# Patient Record
Sex: Female | Born: 1985 | Race: White | Hispanic: Yes | Marital: Single | Smoking: Never smoker
Health system: Southern US, Community
[De-identification: ages and names within clinical notes are randomized; demographics above are authoritative.]

## PROBLEM LIST (undated history)

## (undated) DIAGNOSIS — R87619 Unspecified abnormal cytological findings in specimens from cervix uteri: Secondary | ICD-10-CM

---
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
## 2013-07-30 DIAGNOSIS — R87619 Unspecified abnormal cytological findings in specimens from cervix uteri: Secondary | ICD-10-CM

## 2013-07-30 HISTORY — DX: Unspecified abnormal cytological findings in specimens from cervix uteri: R87.619

## 2017-04-03 ENCOUNTER — Other Ambulatory Visit: Payer: Self-pay | Admitting: Nurse Practitioner

## 2017-04-03 DIAGNOSIS — Z3482 Encounter for supervision of other normal pregnancy, second trimester: Secondary | ICD-10-CM

## 2017-04-10 ENCOUNTER — Ambulatory Visit
Admission: RE | Admit: 2017-04-10 | Discharge: 2017-04-10 | Disposition: A | Discharge status: Home / Self Care | Payer: Medicaid Other | Source: Ambulatory Visit | Attending: Nurse Practitioner | Admitting: Nurse Practitioner

## 2017-04-10 DIAGNOSIS — O321XX0 Maternal care for breech presentation, not applicable or unspecified: Secondary | ICD-10-CM | POA: Insufficient documentation

## 2017-04-10 DIAGNOSIS — Z3689 Encounter for other specified antenatal screening: Secondary | ICD-10-CM | POA: Diagnosis present

## 2017-04-10 DIAGNOSIS — O3412 Maternal care for benign tumor of corpus uteri, second trimester: Secondary | ICD-10-CM | POA: Diagnosis not present

## 2017-04-10 DIAGNOSIS — Z3A15 15 weeks gestation of pregnancy: Secondary | ICD-10-CM | POA: Diagnosis not present

## 2017-04-10 DIAGNOSIS — Z3482 Encounter for supervision of other normal pregnancy, second trimester: Secondary | ICD-10-CM

## 2017-04-18 ENCOUNTER — Other Ambulatory Visit (HOSPITAL_COMMUNITY): Payer: Self-pay | Admitting: Family Medicine

## 2017-04-18 DIAGNOSIS — Z3689 Encounter for other specified antenatal screening: Secondary | ICD-10-CM

## 2017-05-16 ENCOUNTER — Ambulatory Visit
Admission: RE | Admit: 2017-05-16 | Discharge: 2017-05-16 | Disposition: A | Discharge status: Home / Self Care | Payer: Medicaid Other | Source: Ambulatory Visit | Attending: Obstetrics and Gynecology | Admitting: Obstetrics and Gynecology

## 2017-05-16 ENCOUNTER — Encounter: Payer: Self-pay | Admitting: *Deleted

## 2017-05-16 DIAGNOSIS — O2632 Retained intrauterine contraceptive device in pregnancy, second trimester: Secondary | ICD-10-CM | POA: Diagnosis not present

## 2017-05-16 DIAGNOSIS — Z3689 Encounter for other specified antenatal screening: Secondary | ICD-10-CM | POA: Insufficient documentation

## 2017-05-16 DIAGNOSIS — Z3A19 19 weeks gestation of pregnancy: Secondary | ICD-10-CM | POA: Diagnosis not present

## 2017-07-15 LAB — HM HIV SCREENING LAB: HM HIV Screening: NEGATIVE

## 2017-12-27 ENCOUNTER — Encounter (HOSPITAL_COMMUNITY): Payer: Self-pay

## 2018-10-23 IMAGING — US US OB COMP +14 WK
1 series · 13 of 28 positions shown · non-contrast
Comparison: none

CLINICAL DATA: Unknown dates, supervision of normal pregnancy in
the second trimester.

EXAM:
OBSTETRICAL ULTRASOUND >14 WKS

[Series 1: us ob comp +14 wk · 39 acquisitions, 13 frames shown]
[im 2/39]
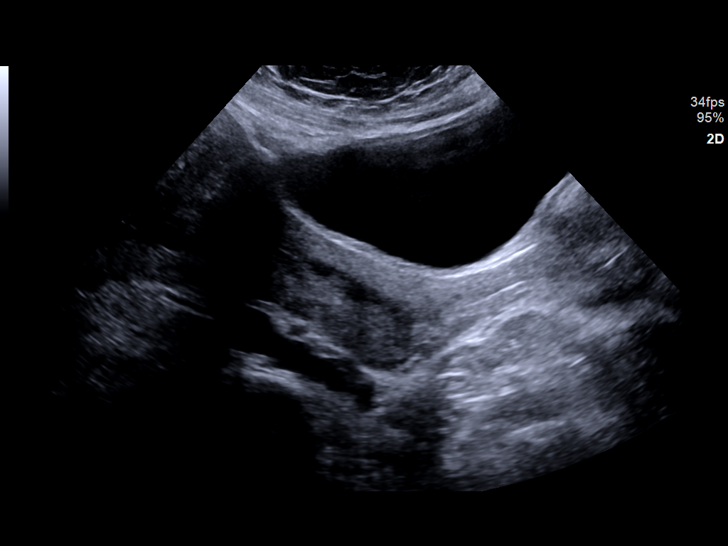
[im 5/39]
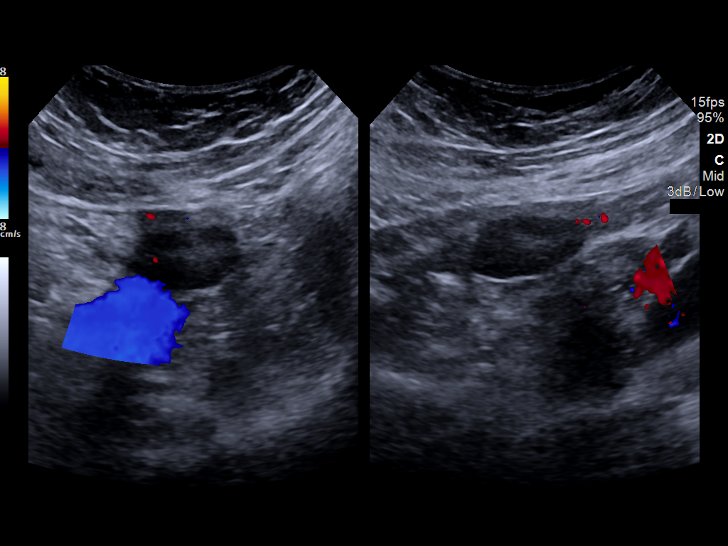
[im 8/39]
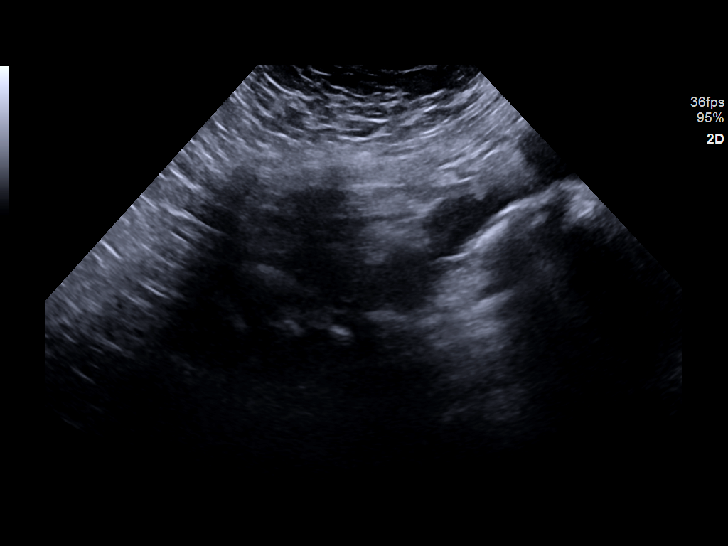
[im 10/39]
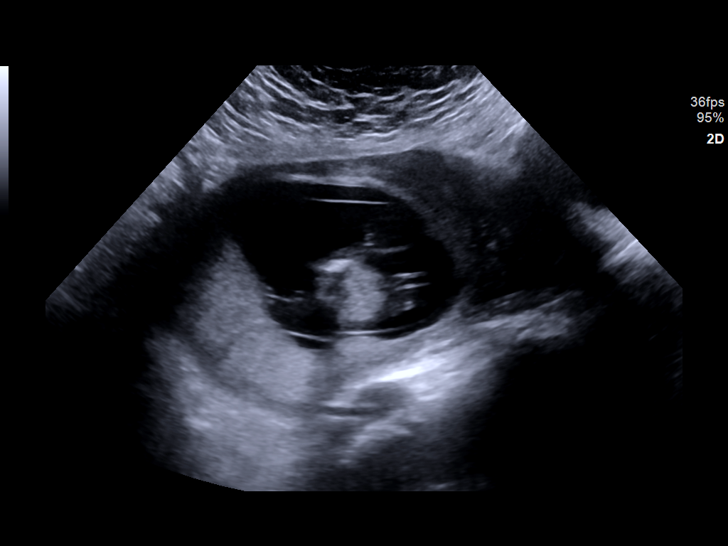
[im 13/39]
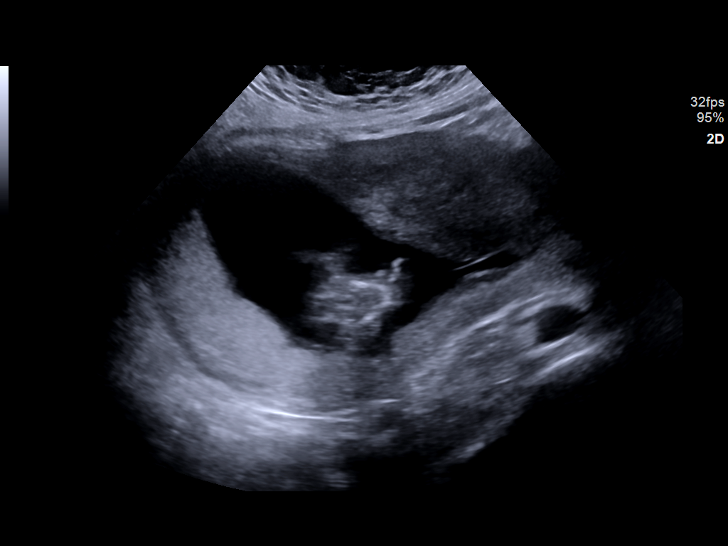
[im 16/39]
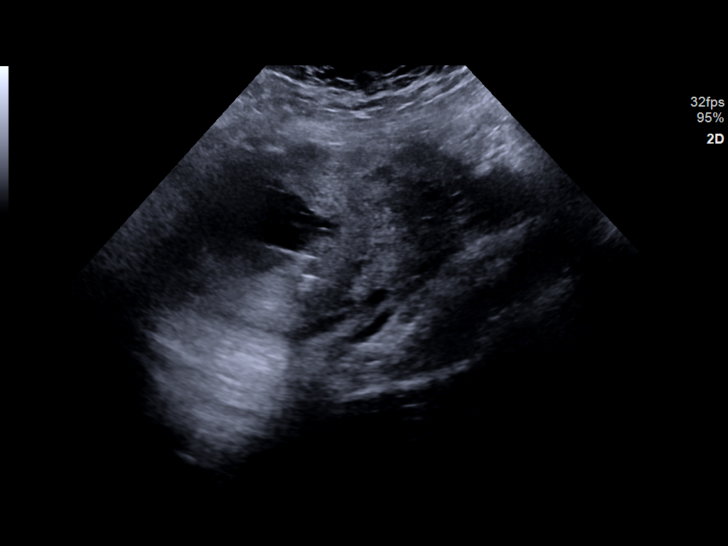
[im 20/39]
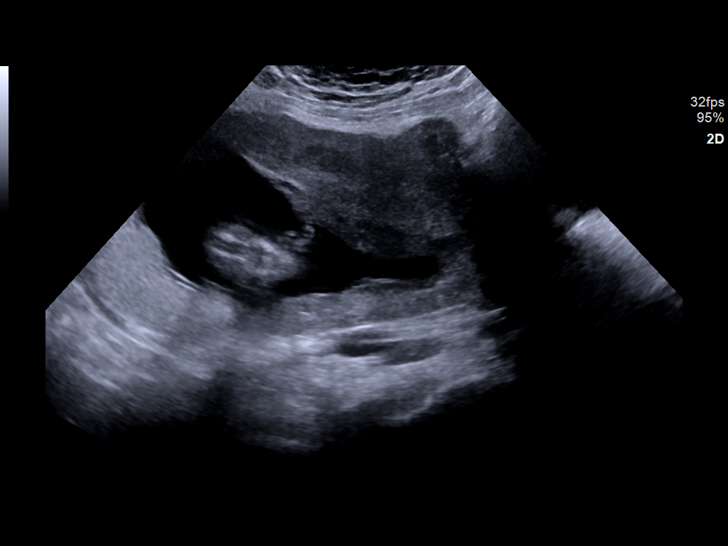
[im 23/39]
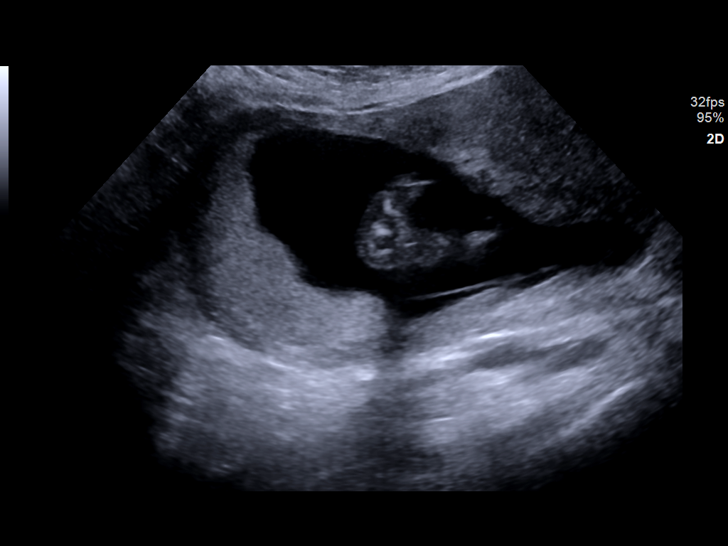
[im 26/39]
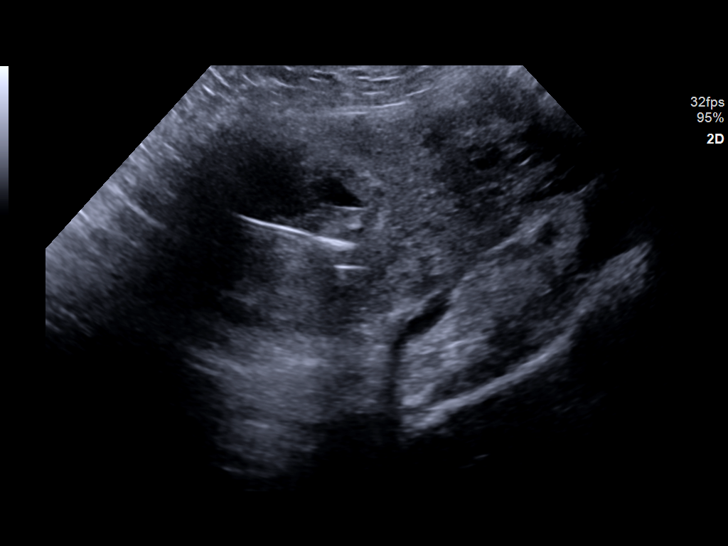
[im 29/39]
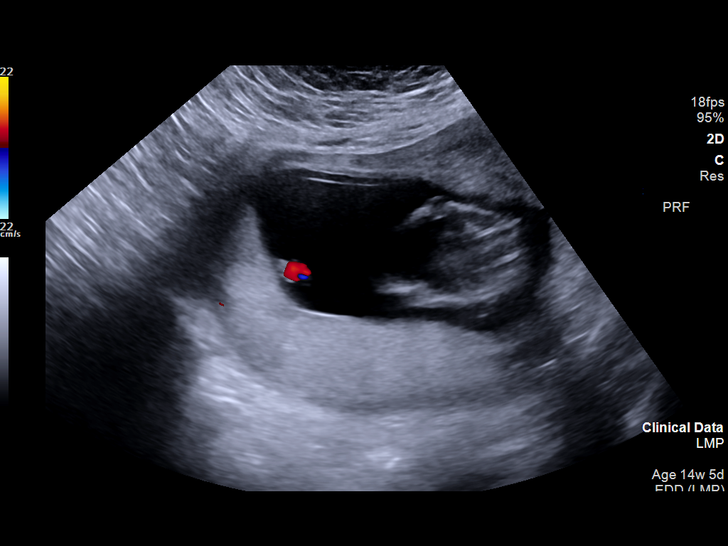
[im 31/39]
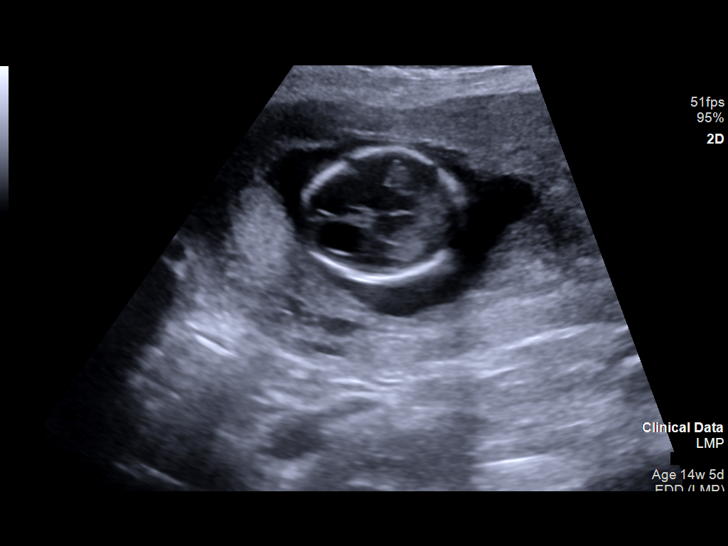
[im 34/39]
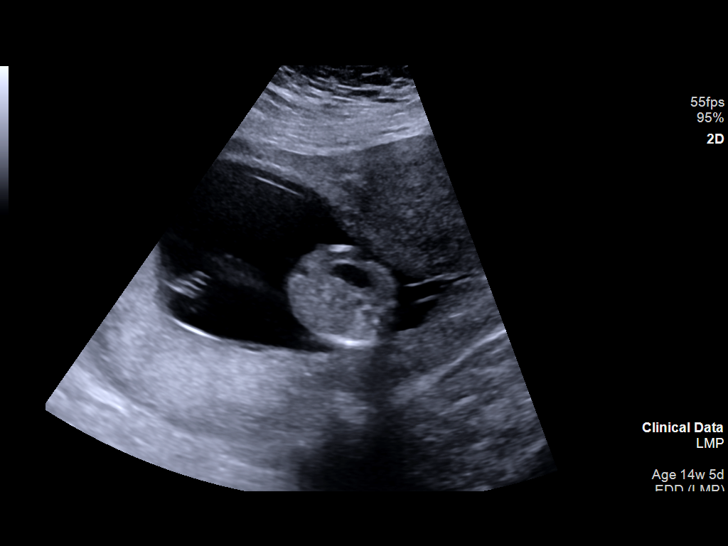
[im 37/39]
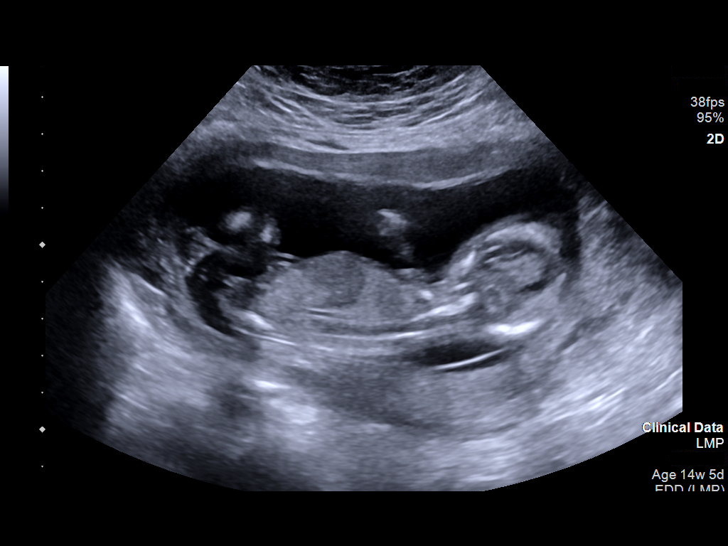

[13 of 28 positions shown; findings below may reference images not displayed]

FINDINGS: Number of Fetuses: 1

Heart Rate:  143 bpm

Movement: Present

Presentation: Breech

Previa: None. The distance from the lower placental segment to the
internal cervical os is 6.7 cm

Placental Location: Posterior partially fundal and partially
right-sided.

Amniotic Fluid (Subjective): Normal

Vertical pocket 3.5cm

FETAL BIOMETRY

BPD:  2.88cm 15w 1d

HC:    10.69cm  15w   1d

AC:   9.08cm  15w   2d

FL:   1.69cm  15w   0d

Current Mean GA: 15w 1d              US EDC: October 01, 2017

FETAL ANATOMY

Lateral Ventricles: Not visualized

Thalami/CSP: Not visualized

Posterior Fossa:  Not visualize

Nuchal Region: Not visualized

Upper Lip: Not visualize

Spine: Not visualized

4 Chamber Heart on Left: Visualized

LVOT: Not visualize

RVOT: Not visualize

Stomach on Left: Visualized

3 Vessel Cord: Visualized

Cord Insertion site: Visualized

Kidneys: Not visualized

Bladder: Visualized

Extremities: Not visualized

Sex: Not visualized

This study is quite limited due to the early gestational age.

Maternal Findings: There is an anterior fundal fibroid that appears
pedunculated measuring 2.1 x 1.1 x 1.8 cm. Normal appearing ovaries

Cervix:  5.2 cm, closed.
IMPRESSION: Single viable IUP with estimated gestational age of 15 weeks 1 day
with estimated date of confinement October 01, 2017. The anatomic
survey was quite limited due to the early age. Follow-up anatomic
scan at 20-22 weeks is recommended.

The amniotic fluid volume is estimated to be normal. The placenta is
partially fundal, or posterior, and right sided.

Pedunculated anterior fundal fibroid measuring up to 2.1 cm in
diameter.

## 2018-11-20 LAB — HM PAP SMEAR: HM Pap smear: NEGATIVE

## 2019-03-02 ENCOUNTER — Encounter: Payer: Self-pay | Admitting: Physician Assistant

## 2019-03-02 ENCOUNTER — Ambulatory Visit: Payer: Medicaid Other | Admitting: Physician Assistant

## 2019-03-02 ENCOUNTER — Ambulatory Visit: Payer: Medicaid Other

## 2019-03-02 ENCOUNTER — Other Ambulatory Visit: Payer: Self-pay

## 2019-03-02 DIAGNOSIS — B3731 Acute candidiasis of vulva and vagina: Secondary | ICD-10-CM

## 2019-03-02 DIAGNOSIS — Z113 Encounter for screening for infections with a predominantly sexual mode of transmission: Secondary | ICD-10-CM

## 2019-03-02 DIAGNOSIS — B373 Candidiasis of vulva and vagina: Secondary | ICD-10-CM

## 2019-03-02 LAB — WET PREP FOR TRICH, YEAST, CLUE
Trichomonas Exam: NEGATIVE
Yeast Exam: NEGATIVE

## 2019-03-02 MED ORDER — FLUCONAZOLE 150 MG PO TABS: 150.0000 mg | ORAL_TABLET | Freq: Once | ORAL | 0 refills | Status: AC

## 2019-03-02 NOTE — Progress Notes (Signed)
STI clinic/screening visit  Subjective:  Katie Cruz is a 33 y.o. female being seen today for an STI screening visit. The patient reports they do have symptoms.  Patient has the following medical conditions:  There are no active problems to display for this patient.    Chief Complaint  Patient presents with  . SEXUALLY TRANSMITTED DISEASE    HPI  Patient reports that she has had vaginal itching for about 1 week.  Used OTC yeast treatment with only slight relief.  Last use of OTC cream was last pm externally with Vagisil.  States that she had this problem previously, about 3-4 years ago and we gave her a pill that cleared things up for her.    See flowsheet for further details and programmatic requirements.    The following portions of the patient's history were reviewed and updated as appropriate: allergies, current medications, past medical history, past social history, past surgical history and problem list.  Objective:  There were no vitals filed for this visit.  Physical Exam Constitutional:      General: She is not in acute distress.    Appearance: Normal appearance.  HENT:     Head: Normocephalic and atraumatic.     Mouth/Throat:     Mouth: Mucous membranes are moist.     Pharynx: Oropharynx is clear. No oropharyngeal exudate or posterior oropharyngeal erythema.  Neck:     Musculoskeletal: Neck supple.  Pulmonary:     Effort: Pulmonary effort is normal.  Abdominal:     Palpations: Abdomen is soft. There is no mass.     Tenderness: There is no abdominal tenderness. There is no guarding or rebound.  Genitourinary:    Rectum: Normal.     Comments: External genitalia/pubic area without nits, lice, edema, lesions and  Inguinal adenopathy.  Bilateral labia majora with mild erythema, slight scaling. Vagina with normal mucosa, small amount of white, thick, slightly clumpy discharge.  Cervix without visible lesions. Uterus normal size, firm, mobile, nt, no  masses, no CMT, no adnexal tenderness or fullness.  Lymphadenopathy:     Cervical: No cervical adenopathy.  Skin:    General: Skin is warm and dry.     Findings: No bruising, erythema, lesion or rash.  Neurological:     Mental Status: She is alert and oriented to person, place, and time.  Psychiatric:        Mood and Affect: Mood normal.        Behavior: Behavior normal.        Thought Content: Thought content normal.        Judgment: Judgment normal.       Assessment and Plan:  Katie Cruz is a 33 y.o. female presenting to the [facility] for STI screening  1. Screening for STD (sexually transmitted disease) Patient with vaginal itching not relieved with OTC products. Rec condoms with all sex Await test results.  Counseled that RN will call if needs to RTC for further treatment once results are back - WET PREP FOR TRICH, YEAST, CLUE - Chlamydia/Gonorrhea [place] [facility] - HIV [place] [facility] - Syphilis Serology, [place] [facility]  2. Candidiasis of vulva and vagina Will treat with Fluconazole 150 mg #1 take po one time Rec continue with OTC cream externally for symptom control for 24 hrs. RTC if needed if symptoms do not resolve. - fluconazole (DIFLUCAN) 150 MG tablet; Take 1 tablet (150 mg total) by mouth once for 1 dose.  Dispense: 1 tablet; Refill: 0     No follow-ups on file.  No future appointments.  Jerene Dilling, PA

## 2019-05-08 IMAGING — US US MFM OB DETAIL+14 WK
1 series · 12 of 28 positions shown · non-contrast
Comparison: none

PATIENT INFO:

PERFORMED BY:
NOOSHIN
SERVICE(S) PROVIDED:
INDICATIONS:
19 weeks gestation of pregnancy
IUD in pregnancy
FETAL EVALUATION:
Num Of Fetuses:     1
Fetal Heart         139
Rate(bpm):
Fetal Lie:          Maternal Left
Presentation:       Cephalic
Placenta:           Posterior Grade 0, No previa
P. Cord Insertion:  Normal
BIOMETRY:
BPD:      46.8  mm     G. Age:  20w 1d         63  %    CI:        73.31   %    70 - 86
FL/HC:      18.5   %    16.8 -
HC:      173.7  mm     G. Age:  19w 6d         45  %    HC/AC:      1.10        1.09 -
AC:      157.3  mm     G. Age:  20w 6d         77  %    FL/BPD:     68.6   %
FL:       32.1  mm     G. Age:  20w 0d         48  %    FL/AC:      20.4   %    20 - 24
HUM:      31.6  mm     G. Age:  20w 4d         71  %
CER:      19.2  mm     G. Age:  18w 4d         15  %
NFT:       3.9  mm
CM:        2.9  mm
Est. FW:     352  gm    0 lb 12 oz      71  %
GESTATIONAL AGE:
LMP:           19w 6d        Date:  12/28/16                 EDD:   10/04/17
U/S Today:     20w 2d                                        EDD:   10/01/17
Best:          19w 6d     Det. By:  LMP  (12/28/16)          EDD:   10/04/17
ANATOMY:
Cranium:               Within Normal Limits   Aortic Arch:            Normal appearance
Cavum:                 CSP visualized         Ductal Arch:            Normal appearance
Ventricles:            Normal appearance      Diaphragm:              Within Normal Limits
Choroid Plexus:        Within Normal Limits   Stomach:                Seen
Cerebellum:            Within Normal Limits   Abdomen:                Within Normal
Limits
Posterior Fossa:       Within Normal Limits   Abdominal Wall:         Normal appearance
Nuchal Fold:           Within Normal Limits   Cord Vessels:           3 vessels
Face:                  Orbits visualized      Kidneys:                Normal appearance
Lips:                  Normal appearance      Bladder:                Seen
Thoracic:              Within Normal Limits   Spine:                  Normal appearance
Heart:                 4-Chamber view         Upper Extremities:      Visualized
appears normal
RVOT:                  Normal appearance      Lower Extremities:      Visualized
LVOT:                  Normal appearance
CERVIX UTERUS ADNEXA:
Cervix
Length:           3.78  cm.

[Series 1: us mfm ob detail+14 wk · 0.23mm/px · 12 of 120 slices shown]
[im 5/120]
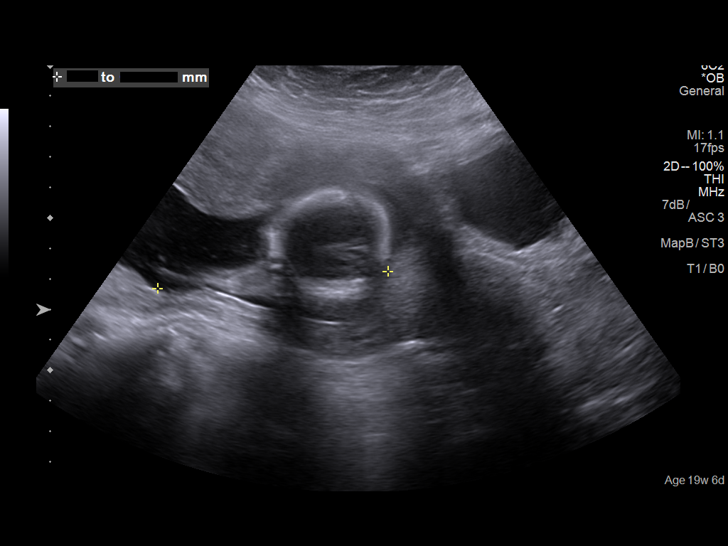
[im 14/120]
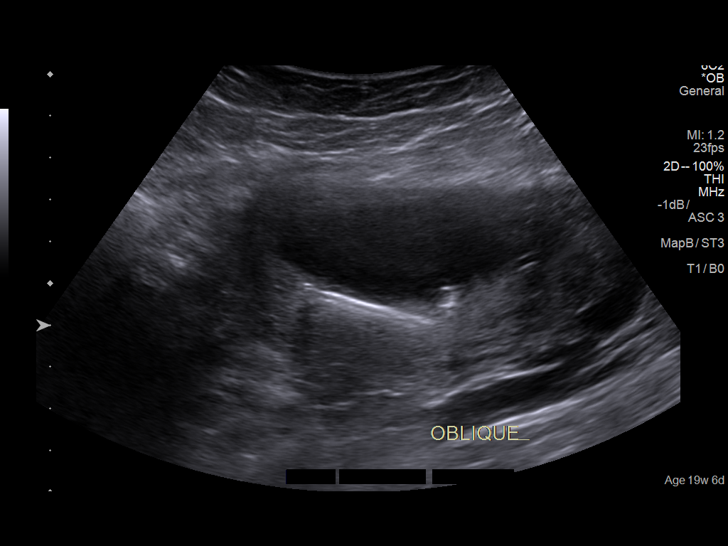
[im 23/120]
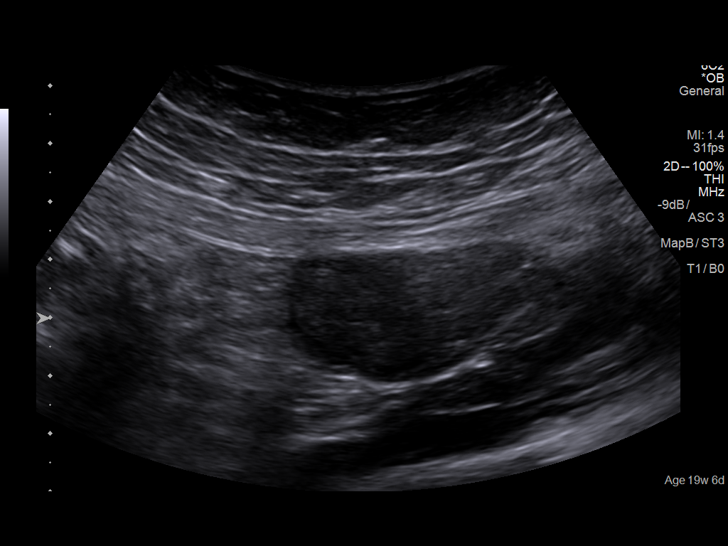
[im 36/120]
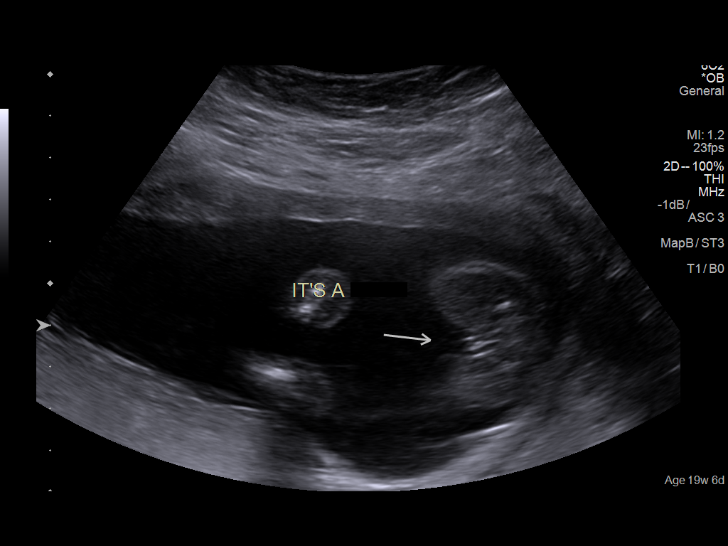
[im 45/120]
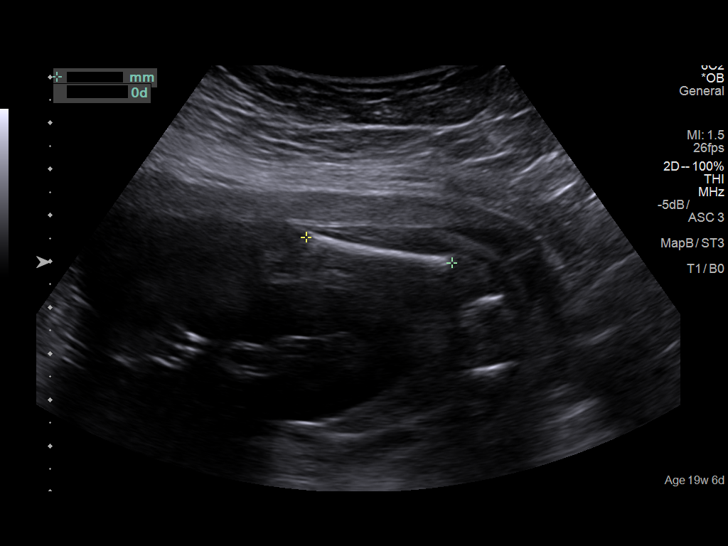
[im 53/120]
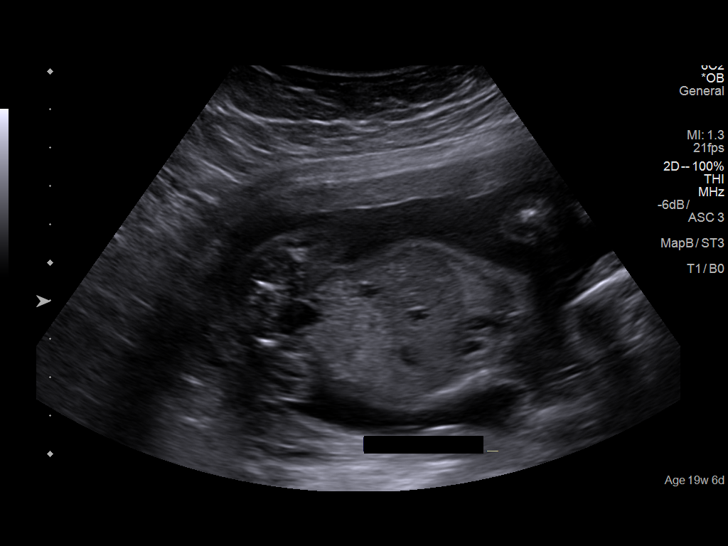
[im 67/120]
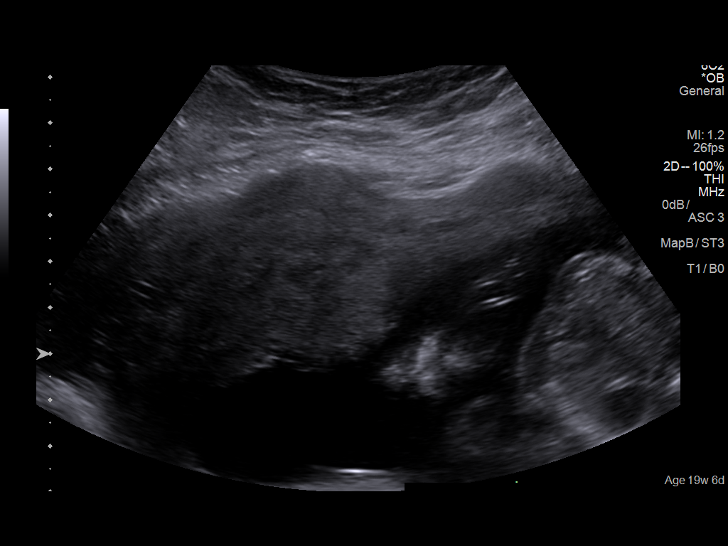
[im 75/120]
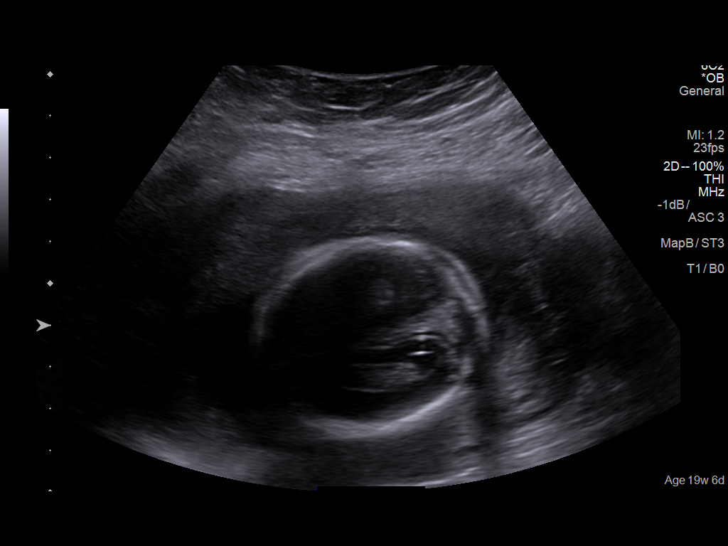
[im 84/120]
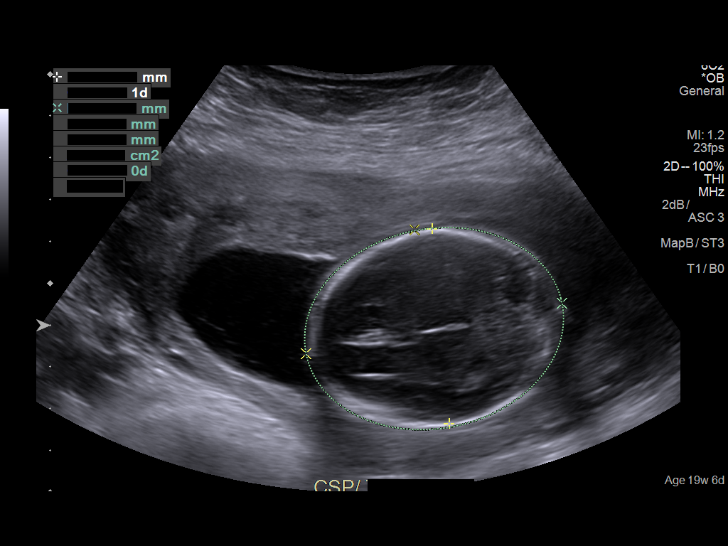
[im 97/120]
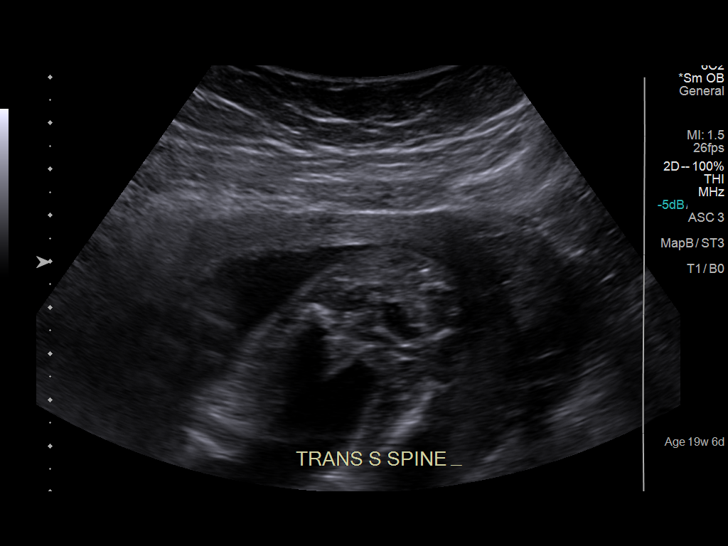
[im 106/120]
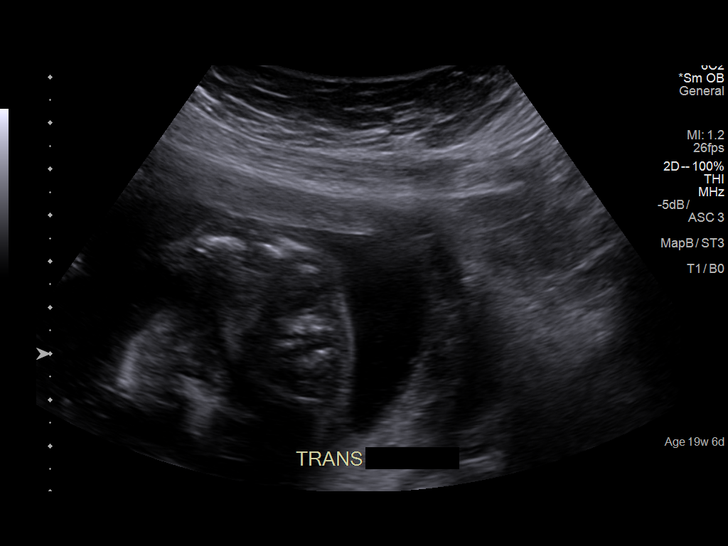
[im 115/120]
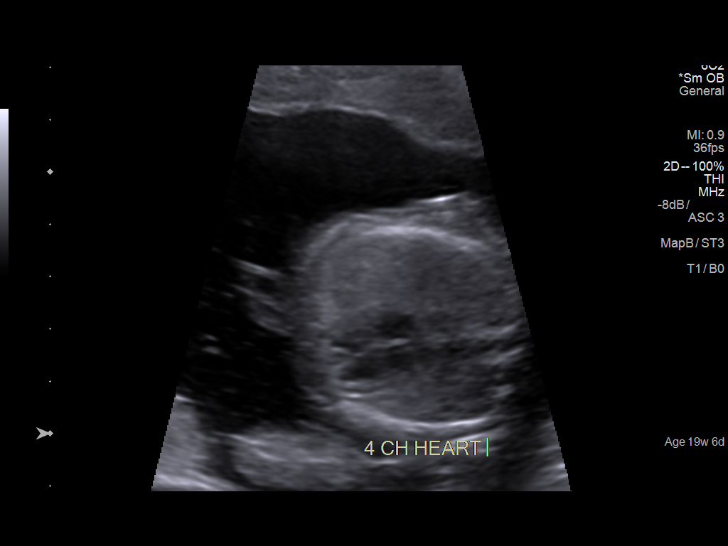

[12 of 28 positions shown; findings below may reference images not displayed]

IMPRESSION: Dear   Dr  NOOSHIN,

Thank you for referring your patient  for a fetal anatomical
survey.

There is a singleton gestation with subjectively normal
amniotic fluid volume.

The fetal biometry correlates with established dating. Pt had
a LMP of 12/28/2016 with Zakria Jamil IUD in place placing her at
19w 6d  with an EDC of 10/04/2017.

Detailed evaluation of the fetal anatomy was performed.  The
fetal anatomical survey appears within normal limits within
the resolution of ultrasound as described above.  It must be
noted that a normal ultrasound cannot rule out aneuploidy.

The Paragard IUD is identified in the upper left fundus.

The presence of an IUD in pregnancy may increase the risk
of a preterm delivery/low birthweight infant .

Thank you for allowing us to participate in your patient's care.

assistance

## 2019-07-06 ENCOUNTER — Ambulatory Visit: Payer: Self-pay | Admitting: Physician Assistant

## 2019-07-06 ENCOUNTER — Other Ambulatory Visit: Payer: Self-pay

## 2019-07-06 DIAGNOSIS — Z299 Encounter for prophylactic measures, unspecified: Secondary | ICD-10-CM

## 2019-07-06 DIAGNOSIS — Z113 Encounter for screening for infections with a predominantly sexual mode of transmission: Secondary | ICD-10-CM

## 2019-07-06 LAB — WET PREP FOR TRICH, YEAST, CLUE
Trichomonas Exam: NEGATIVE
Yeast Exam: NEGATIVE

## 2019-07-06 MED ORDER — FLUCONAZOLE 150 MG PO TABS: 150.0000 mg | ORAL_TABLET | Freq: Once | ORAL | 0 refills | Status: AC

## 2019-07-07 ENCOUNTER — Encounter: Payer: Self-pay | Admitting: Physician Assistant

## 2019-07-07 NOTE — Progress Notes (Signed)
STI clinic/screening visit  Subjective:  Katie Cruz is a 33 y.o. female being seen today for an STI screening visit. The patient reports they do have symptoms.   Patient has the following medical conditions:  There are no active problems to display for this patient.    Chief Complaint  Patient presents with  . SEXUALLY TRANSMITTED DISEASE    HPI  Patient reports that she has had itching in her vaginal area for about 1 month and has tried OTC creams which relieve symptoms for a day or two and then the itching returns.  States that this happened before and she got a pill that resolved the itching for a long time.  LMP 06/19/2019 and normal.  Using IUD as BCM.  See flowsheet for further details and programmatic requirements.    The following portions of the patient's history were reviewed and updated as appropriate: allergies, current medications, past medical history, past social history, past surgical history and problem list.  Objective:  There were no vitals filed for this visit.  Physical Exam Constitutional:      General: She is not in acute distress.    Appearance: Normal appearance. She is normal weight.  HENT:     Head: Normocephalic and atraumatic.     Comments: No nits, lice, or hair loss. No cervical, supraclavicular or axillary adenopathy.    Mouth/Throat:     Mouth: Mucous membranes are moist.     Pharynx: Oropharynx is clear. No oropharyngeal exudate or posterior oropharyngeal erythema.  Eyes:     Conjunctiva/sclera: Conjunctivae normal.  Neck:     Musculoskeletal: Neck supple. No muscular tenderness.  Pulmonary:     Effort: Pulmonary effort is normal.  Abdominal:     Palpations: Abdomen is soft. There is no mass.     Tenderness: There is no abdominal tenderness. There is no guarding or rebound.  Genitourinary:    General: Normal vulva.     Rectum: Normal.     Comments: External genitalia/pubic area without nits, lice, edema, erythema,  lesions, and inguinal adenopathy. Vagina with normal mucosa and a small amount of clumpy, white discharge, pH=4.5. Cervix without visible lesions. Uterus firm, mobile, nt, no masses, no CMT, no adnexal tenderness or fullness. Skin:    General: Skin is warm and dry.     Findings: No bruising, erythema, lesion or rash.  Neurological:     Mental Status: She is alert and oriented to person, place, and time.  Psychiatric:        Mood and Affect: Mood normal.        Behavior: Behavior normal.        Thought Content: Thought content normal.        Judgment: Judgment normal.       Assessment and Plan:  Katie Cruz is a 33 y.o. female presenting to the [facility] Department for STI screening  1. Screening for STD (sexually transmitted disease) Patient into clinic with symptoms. Rec condoms with all sex. Await test results.  Counseled that RN will call if needs to RTC for treatment once results are back.  - WET PREP FOR [place], YEAST, CLUE - Chlamydia/Gonorrhea [place] [facility] - HIV [place] [facility] - Syphilis Serology, [place] [facility]  2. Prophylactic measure Will treat with Fluconazole 150mg  #1 po one time due to symptoms and exam findings. No sex for 7 days. Reviewed steps to prevent yeast infection with patient. - fluconazole (DIFLUCAN) 150 MG tablet; Take  1 tablet (150 mg total) by mouth once for 1 dose.  Dispense: 1 tablet; Refill: 0     No follow-ups on file.  No future appointments.  Matt Holmes, PA

## 2019-10-04 NOTE — Progress Notes (Signed)
Chart reviewed by Pharmacist  Suzanne Walker PharmD, Contract Pharmacist at  [facility]  

## 2020-01-20 ENCOUNTER — Encounter: Payer: Self-pay | Admitting: Advanced Practice Midwife

## 2020-01-20 ENCOUNTER — Ambulatory Visit (LOCAL_COMMUNITY_HEALTH_CENTER): Payer: Self-pay | Admitting: Advanced Practice Midwife

## 2020-01-20 ENCOUNTER — Other Ambulatory Visit: Payer: Self-pay

## 2020-01-20 VITALS — BP 99/61 | Ht 62.0 in | Wt 168.0 lb

## 2020-01-20 DIAGNOSIS — E669 Obesity, unspecified: Secondary | ICD-10-CM | POA: Insufficient documentation

## 2020-01-20 DIAGNOSIS — Z3009 Encounter for other general counseling and advice on contraception: Secondary | ICD-10-CM

## 2020-01-20 DIAGNOSIS — Z30431 Encounter for routine checking of intrauterine contraceptive device: Secondary | ICD-10-CM

## 2020-01-20 LAB — WET PREP FOR TRICH, YEAST, CLUE
Trichomonas Exam: NEGATIVE
Yeast Exam: NEGATIVE

## 2020-01-20 MED ORDER — THERA VITAL M PO TABS: 1.0000 | ORAL_TABLET | Freq: Every day | ORAL | 0 refills | Status: AC

## 2020-01-20 NOTE — Progress Notes (Signed)
Family Planning Visit- Initial Visit  Subjective:  Katie Cruz is a 34 y.o. MHF nonsmoker G5P5   being seen today for an initial well woman visit and c/o spotting/bleeding since LMP 01/07/20.  She is currently using paraguard for pregnancy prevention. Patient reports she does not want a pregnancy in the next year.  Patient has the following medical conditions has Obesity BMI=30.7 on their problem list.  Chief Complaint  Patient presents with  . Annual Exam    Patient reports bleeding since LMP 01/07/20. Paraguard inserted 11/19/17.  Last pap 11/20/18 neg HPV neg.  Last PE 10/2017.  CBE due 2023.  Last sex 01/02/20 without condom; with same partner x 5 years. Patient denies cigs, MJ, ETOH.  Living with husband and 4 children.  Working [employer]   Body mass index is 30.73 kg/m. - Patient is eligible for diabetes screening based on BMI and age >62?  not applicable IR4W ordered? not applicable  Patient reports 1 of partners in last year. Desires STI screening?  Yes  Has patient been screened once for HCV in the past?  No  No results found for: HCVAB  Does the patient have current of drug use, have a partner with drug use, and/or has been incarcerated since last result? No  If yes-- Screen for HCV through [facility]   Does the patient meet criteria for HBV testing? No  Criteria:  -Household, sexual or needle sharing contact with HBV -History of drug use -HIV positive -Those with known Hep C   Health Maintenance Due  Topic Date Due  . Hepatitis C Screening  Never done  . COVID-19 Vaccine (1) Never done  . TETANUS/TDAP  Never done    Review of Systems  All other systems reviewed and are negative.   The following portions of the patient's history were reviewed and updated as appropriate: allergies, current medications, past family history, past medical history, past social history, past surgical history and problem list. Problem list updated.   See flowsheet for other program  required questions.  Objective:   Vitals:   01/20/20 0938  BP: 99/61  Weight: 168 lb (76.2 kg)  Height: 5\' 2"  (1.575 m)    Physical Exam Constitutional:      Appearance: Normal appearance. She is obese.  HENT:     Head: Normocephalic and atraumatic.     Mouth/Throat:     Mouth: Mucous membranes are moist.  Eyes:     Conjunctiva/sclera: Conjunctivae normal.  Cardiovascular:     Rate and Rhythm: Normal rate and regular rhythm.  Pulmonary:     Effort: Pulmonary effort is normal.     Breath sounds: Normal breath sounds.  Abdominal:     Palpations: Abdomen is soft.     Comments: Soft without masses or tenderness, poor tone, increased adipose  Genitourinary:    General: Normal vulva.     Exam position: Lithotomy position.     Vagina: Bleeding (red menses bleeding,ph>4.5) present.     Cervix: Normal.     Uterus: Normal.      Adnexa: Right adnexa normal and left adnexa normal.     Rectum: Normal.     Comments: Paraguard strings visible and palpated Musculoskeletal:        General: Normal range of motion.     Cervical back: Normal range of motion and neck supple.  Skin:    General: Skin is warm and dry.  Neurological:     Mental Status: She is alert.  Psychiatric:        Mood and Affect: Mood normal.       Assessment and Plan:  Katie Cruz is a 34 y.o. MHF G5P5 nonsmoker female presenting to the [facility] Department for an initial well woman exam c/o spotting/bleeding since LMP 01/07/20  Contraception counseling: Reviewed all forms of birth control options in the tiered based approach. available including abstinence; over the counter/barrier methods; hormonal contraceptive medication including pill, patch, ring, injection,contraceptive implant, ECP; hormonal and nonhormonal IUDs; permanent sterilization options including vasectomy and the various tubal sterilization modalities. Risks, benefits, and typical effectiveness rates were reviewed.  Questions  were answered.  Written information was also given to the patient to review.  Patient desires nothing, happy with Paraguard, this was prescribed for patient. She will follow up in prn for surveillance.  She was told to call with any further questions, or with any concerns about this method of contraception.  Emphasized use of condoms 100% of the time for STI prevention.  Patient was offered ECP. ECP was not accepted by the patient. ECP counseling was not given - see RN documentation  1. Obesity, unspecified classification, unspecified obesity type, unspecified whether serious comorbidity present   2. Family planning Treat wet mount per standing orders Immunization nurse consult Pt to monitor bleeding and mark on calendar.  RTC if continues with next menses - WET PREP FOR TRICH, YEAST, CLUE - Chlamydia/Gonorrhea [place] [facility]  3. Encounter for routine checking of intrauterine contraceptive device (IUD)      No follow-ups on file.  No future appointments.  Alberteen Spindle, CNM

## 2020-01-20 NOTE — Progress Notes (Signed)
Wet mount reviewed, no treatment indicated. MVI given and patient counseled, per provider, to keep track of her spotting and see what happens with menses next month, and to call if she has further questions. Patient states understanding.Burt Knack, RN

## 2020-01-20 NOTE — Progress Notes (Signed)
Patient here for yearly PE and c/o past 2 weeks spotting. Last PE 10/2017 and has Paraguard since 10/2017. Last Pap 11/20/2018, negative and HPV negative.Burt Knack, RN

## 2022-07-20 ENCOUNTER — Ambulatory Visit: Payer: Self-pay | Admitting: Nurse Practitioner

## 2022-07-20 DIAGNOSIS — Z113 Encounter for screening for infections with a predominantly sexual mode of transmission: Secondary | ICD-10-CM

## 2022-07-20 DIAGNOSIS — N898 Other specified noninflammatory disorders of vagina: Secondary | ICD-10-CM

## 2022-07-20 LAB — WET PREP FOR TRICH, YEAST, CLUE
Trichomonas Exam: NEGATIVE
Yeast Exam: NEGATIVE

## 2022-07-20 MED ORDER — CLOTRIMAZOLE 3 2 % VA CREA: 1.0000 | TOPICAL_CREAM | Freq: Every day | VAGINAL | 0 refills | Status: AC

## 2022-07-20 NOTE — Progress Notes (Signed)
Patient treated per provider orders. Clotrimazole given.Burt Knack, RN

## 2022-07-20 NOTE — Progress Notes (Signed)
Patient here for STD testing.Livingston Denner Brewer-Jensen, RN 

## 2022-07-21 ENCOUNTER — Encounter: Payer: Self-pay | Admitting: Nurse Practitioner

## 2022-07-21 NOTE — Progress Notes (Signed)
[facility] Department  STI clinic/screening visit [address] [place] [address] [phone]  Subjective:  Katie Cruz is a 36 y.o. female being seen today for an STI screening visit. The patient reports they do have symptoms.  Patient reports that they do not desire a pregnancy in the next year.   They reported they are not interested in discussing contraception today.   Patient reports having an IUD.    Patient's last menstrual period was 07/03/2022 (approximate).   Patient has the following medical conditions:   Patient Active Problem List   Diagnosis Date Noted   Obesity BMI=30.7 01/20/2020    Chief Complaint  Patient presents with   SEXUALLY TRANSMITTED DISEASE    HPI  Patient reports to clinic for an STD screening.  Patient reports some genital itching that has been present for one month and pretty consistent surrounding her last menstrual period.    Does the patient using douching products? No  Last HIV test per patient/review of record was: Lab Results  Component Value Date   HMHIVSCREEN Negative - Validated 07/15/2017   Patient reports last pap was: 11/20/2018  Screening for MPX risk: Does the patient have an unexplained rash? No Is the patient MSM? No Does the patient endorse multiple sex partners or anonymous sex partners? No Did the patient have close or sexual contact with a person diagnosed with MPX? No Has the patient traveled outside the [place] where MPX is endemic? No Is there a high clinical suspicion for MPX-- evidenced by one of the following No  -Unlikely to be chickenpox  -Lymphadenopathy  -Rash that present in same phase of evolution on any given body part See flowsheet for further details and programmatic requirements.   Immunization history:   There is no immunization history on file for this patient.   The following portions of the patient's history were reviewed and updated as appropriate: allergies,  current medications, past medical history, past social history, past surgical history and problem list.  Objective:  There were no vitals filed for this visit.  Physical Exam Constitutional:      Appearance: Normal appearance.  HENT:     Head: Normocephalic. No abrasion, masses or laceration. Hair is normal.     Right Ear: External ear normal.     Left Ear: External ear normal.     Nose: Nose normal.     Mouth/Throat:     Mouth: No oral lesions.     Pharynx: No oropharyngeal exudate or posterior oropharyngeal erythema.     Tonsils: No tonsillar exudate or tonsillar abscesses.  Eyes:     General: Lids are normal.        Right eye: No discharge.        Left eye: No discharge.     Conjunctiva/sclera: Conjunctivae normal.     Right eye: No exudate.    Left eye: No exudate. Abdominal:     General: Abdomen is flat.     Palpations: Abdomen is soft.     Tenderness: There is no abdominal tenderness. There is no rebound.  Genitourinary:    Pubic Area: No rash or pubic lice.      Labia:        Right: No rash, tenderness, lesion or injury.        Left: No rash, tenderness, lesion or injury.      Vagina: Normal. No vaginal discharge, erythema or lesions.     Cervix: No cervical motion tenderness, discharge,  lesion or erythema.     Uterus: Not enlarged and not tender.      Rectum: Normal.     Comments: Amount Discharge: small  Odor: No pH: less than 4.5 Adheres to vaginal wall: No Color: Katie Cruz   Musculoskeletal:     Cervical back: Full passive range of motion without pain, normal range of motion and neck supple.  Lymphadenopathy:     Cervical: No cervical adenopathy.     Right cervical: No superficial, deep or posterior cervical adenopathy.    Left cervical: No superficial, deep or posterior cervical adenopathy.     Upper Body:     Right upper body: No supraclavicular, axillary or epitrochlear adenopathy.     Left upper body: No supraclavicular, axillary or epitrochlear  adenopathy.     Lower Body: No right inguinal adenopathy. No left inguinal adenopathy.  Skin:    General: Skin is warm and dry.     Findings: No lesion or rash.  Neurological:     Mental Status: She is alert and oriented to person, place, and time.  Psychiatric:        Attention and Perception: Attention normal.        Mood and Affect: Mood normal.        Speech: Speech normal.        Behavior: Behavior normal. Behavior is cooperative.      Assessment and Plan:  Katie Cruz is a 36 y.o. female presenting to the [facility] Department for STI screening  1. Screening examination for venereal disease -Patient accepted all screenings including vaginal CT/GC, wet prep and declines bloodwork for HIV/RPR.  Patient meets criteria for HepB screening? No. Ordered? No - low risk  Patient meets criteria for HepC screening? No. Ordered? No - low risk   Treat wet prep per standing order Discussed time line for [facility] results and that patient will be called with positive results and encouraged patient to call if she had not heard in 2 weeks.  Counseled to return or seek care for continued or worsening symptoms Recommended condom use with all sex  Patient is currently using  IUD  to prevent pregnancy.    - Chlamydia/Gonorrhea [place] [facility] - WET PREP FOR TRICH, YEAST, CLUE  2. Vaginal irritation -Vaginal irritation noted.  Patient advised to apply to vaginal area or place of irritation.  Patient also advised not to have sex for 7 days, wear cotton underwear, limit perfume soaps, and sleep without underwear.   - clotrimazole (CLOTRIMAZOLE 3) 2 % vaginal cream; Place 1 Applicatorful vaginally at bedtime.  Dispense: 21 g; Refill: 0      Total time spent: 30 minutes  Return if symptoms worsen or fail to improve.   Glenna Fellows, FNP

## 2022-08-20 ENCOUNTER — Encounter: Payer: Self-pay | Admitting: Family Medicine

## 2022-08-20 ENCOUNTER — Ambulatory Visit: Payer: Self-pay | Admitting: Family Medicine

## 2022-08-20 DIAGNOSIS — Z113 Encounter for screening for infections with a predominantly sexual mode of transmission: Secondary | ICD-10-CM

## 2022-08-20 DIAGNOSIS — L309 Dermatitis, unspecified: Secondary | ICD-10-CM

## 2022-08-20 LAB — WET PREP FOR TRICH, YEAST, CLUE
Trichomonas Exam: NEGATIVE
Yeast Exam: NEGATIVE

## 2022-08-20 NOTE — Progress Notes (Signed)
[facility] Department  STI clinic/screening visit [place] [address] [phone]  Subjective:  Lada Zeniya Lapidus is a 37 y.o. female being seen today for an STI screening visit. The patient reports they do have symptoms.  Patient reports that they do not desire a pregnancy in the next year.   They reported they are not interested in discussing contraception today.    Patient's last menstrual period was 07/30/2022 (exact date).  Patient has the following medical conditions:   Patient Active Problem List   Diagnosis Date Noted   Obesity BMI=30.7 01/20/2020    Chief Complaint  Patient presents with   SEXUALLY TRANSMITTED DISEASE    Longterm vaginal itching    HPI  Patient reports to clinic with 2 mon hx of severe vulvovaginal itching  Does the patient using douching products? Practitioner oversight- forgot to ask  Last HIV test per patient/review of record was  Lab Results  Component Value Date   HMHIVSCREEN Negative - Validated 07/15/2017   No results found for: "HIV" Patient reports last pap was 11/20/18- Encouraged patient to come back for pap test.  Screening for MPX risk: Does the patient have an unexplained rash? No Is the patient MSM? No Does the patient endorse multiple sex partners or anonymous sex partners? No Did the patient have close or sexual contact with a person diagnosed with MPX? No Has the patient traveled outside the [place] where MPX is endemic? No Is there a high clinical suspicion for MPX-- evidenced by one of the following No  -Unlikely to be chickenpox  -Lymphadenopathy  -Rash that present in same phase of evolution on any given body part See flowsheet for further details and programmatic requirements.   Immunization history:   There is no immunization history on file for this patient.   The following portions of the patient's history were reviewed and updated as appropriate: allergies, current  medications, past medical history, past social history, past surgical history and problem list.  Objective:  There were no vitals filed for this visit.  Physical Exam Vitals and nursing note reviewed.  Constitutional:      Appearance: Normal appearance.  HENT:     Head: Normocephalic and atraumatic.     Mouth/Throat:     Mouth: Mucous membranes are moist.     Pharynx: Oropharynx is clear. No oropharyngeal exudate or posterior oropharyngeal erythema.  Pulmonary:     Effort: Pulmonary effort is normal.  Abdominal:     General: Abdomen is flat.     Palpations: There is no mass.     Tenderness: There is no abdominal tenderness. There is no rebound.  Genitourinary:    Exam position: Lithotomy position.     Pubic Area: No rash or pubic lice.      Tanner stage (genital): 5.     Labia:        Right: Tenderness present. No rash or lesion.        Left: Tenderness present. No rash or lesion.      Vagina: Normal. No vaginal discharge, erythema, bleeding or lesions.     Cervix: No cervical motion tenderness, discharge, friability, lesion or erythema.     Uterus: Normal.      Adnexa: Right adnexa normal and left adnexa normal.     Rectum: Normal.       Comments: pH = 4  Labia with excoriation d/t itching Lymphadenopathy:     Head:     Right side of  head: No preauricular or posterior auricular adenopathy.     Left side of head: No preauricular or posterior auricular adenopathy.     Cervical: No cervical adenopathy.     Upper Body:     Right upper body: No supraclavicular, axillary or epitrochlear adenopathy.     Left upper body: No supraclavicular, axillary or epitrochlear adenopathy.     Lower Body: No right inguinal adenopathy. No left inguinal adenopathy.  Skin:    General: Skin is warm and dry.     Findings: No rash.  Neurological:     Mental Status: She is alert and oriented to person, place, and time.      Assessment and Plan:  Rita Jessah Danser is a 37 y.o. female  presenting to the [facility] Department for STI screening  1. Screening for venereal disease  - WET PREP FOR [place], YEAST, [facility]  2. Dermatitis Patient has a 2 month hx of severe vulvovaginal itching. States it is worse at night, was at the [facility] about 1 month ago- testing for STD came back negative. Denies changes in soaps, detergent, underwear, panty liner use. 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  I was going to Rx: Triamcinolone 0.1% cream BID x 2 weeks, then EOD x 2 weeks. She has this at home and states she does not wish to pick  up a new one. Also advised OTC benadryl HS for 2-4 days.   Asked to RTC if no improvement in 4 weeks.  -Triamcinolone cream 0.1%    Patient accepted screenings including vaginal CT/GC and wet prep. Patient meets criteria for HepB screening? No. Ordered? not applicable Patient meets criteria for HepC screening? No. Ordered? not applicable  Treat wet prep per standing order Discussed time line for [facility] results and that patient will be called with positive results and encouraged patient to call if she had not heard in 2 weeks.  Counseled to return or seek care for continued or worsening symptoms Recommended condom use with all sex  Patient is currently using female condom to prevent pregnancy.    Return if symptoms worsen or fail to improve.  Total time spent 20 minutes  Sharlet Salina, [place]

## 2022-10-22 ENCOUNTER — Ambulatory Visit: Payer: Self-pay

## 2023-03-05 LAB — AMB RESULTS CONSOLE CBG: Glucose: 96

## 2023-03-06 NOTE — Progress Notes (Signed)
Pt states she sees [facility]. No SDOH needs ATT

## 2023-04-22 ENCOUNTER — Encounter: Payer: Self-pay | Admitting: *Deleted

## 2023-04-22 NOTE — Progress Notes (Unsigned)
Pt attended 03/05/23 screening event where her blood sugar was 96. During the event, the pt noted her PCP as the [facility] and did not identify any SDOH. Phone call attempts made to pt, leaving VM, and [facility], which is open Tues, Wed, Thurs.

## 2024-04-09 ENCOUNTER — Telehealth: Payer: Self-pay | Admitting: *Deleted

## 2024-05-06 ENCOUNTER — Other Ambulatory Visit: Payer: Self-pay | Admitting: Nurse Practitioner

## 2024-05-06 DIAGNOSIS — Z1231 Encounter for screening mammogram for malignant neoplasm of breast: Secondary | ICD-10-CM

## 2024-06-04 ENCOUNTER — Ambulatory Visit
Admission: RE | Admit: 2024-06-04 | Discharge: 2024-06-04 | Disposition: A | Discharge status: Home / Self Care | Payer: Self-pay | Source: Ambulatory Visit | Attending: Nurse Practitioner | Admitting: Nurse Practitioner

## 2024-06-04 DIAGNOSIS — Z1231 Encounter for screening mammogram for malignant neoplasm of breast: Secondary | ICD-10-CM | POA: Insufficient documentation

## 2024-06-08 ENCOUNTER — Other Ambulatory Visit: Payer: Self-pay | Admitting: Obstetrics and Gynecology

## 2024-06-08 DIAGNOSIS — R928 Other abnormal and inconclusive findings on diagnostic imaging of breast: Secondary | ICD-10-CM

## 2024-06-17 ENCOUNTER — Telehealth: Payer: Self-pay

## 2024-06-17 NOTE — Telephone Encounter (Signed)
 Pt called and wants to speak with Christy. She stated she just had a mammo and its supposed to be free but she has received a bill. Pt would like a call back.

## 2024-06-22 NOTE — Progress Notes (Unsigned)
 Ms. Katie Cruz is a 38 y.o. G5P0 female who presents to [facility] clinic today with {Blank single:19197::no complaints,complaint of} ***. Patient referred to [facility] due to having a screening mammogram completed 06/04/2024 that additional imaging of the left breast is recommended for follow up.   Pap Smear: Pap smear completed today. Last Pap smear was *** at *** clinic and was {Blank single:19197::normal,abnormal - ***}. Per patient has {Blank single:19197::no history,history} of an abnormal Pap smear. Last Pap smear result {Blank single:19197::is available in,is not available in} Epic.   Physical exam: Breasts Breasts symmetrical. No skin abnormalities bilateral breasts. No nipple retraction bilateral breasts. No nipple discharge bilateral breasts. No lymphadenopathy. No lumps palpated bilateral breasts.      MS 3D SCR MAMMO BILAT BR (aka MM) Result Date: 06/07/2024 CLINICAL DATA:  Screening. EXAM: DIGITAL SCREENING BILATERAL MAMMOGRAM WITH TOMOSYNTHESIS AND CAD TECHNIQUE: Bilateral screening digital craniocaudal and mediolateral oblique mammograms were obtained. Bilateral screening digital breast tomosynthesis was performed. The images were evaluated with computer-aided detection. COMPARISON:  None available. ACR Breast Density Category c: The breasts are heterogeneously dense, which may obscure small masses. FINDINGS: In the left breast, a possible asymmetry warrants further evaluation. In the right breast, no findings suspicious for malignancy. IMPRESSION: Further evaluation is suggested for possible asymmetry in the left breast. RECOMMENDATION: Diagnostic mammogram and possibly ultrasound of the left breast. (Code:[id number]) The patient will be contacted regarding the findings, and additional imaging will be scheduled. BI-RADS CATEGORY  0: Incomplete: Need additional imaging evaluation. Electronically Signed   By: Dina  Arceo M.D.   On: 06/07/2024 15:20    Pelvic/Bimanual Ext Genitalia No  lesions, no swelling and no discharge observed on external genitalia.        Vagina Vagina pink and normal texture. No lesions or discharge observed in vagina.        Cervix Cervix is present. Cervix pink and of normal texture. No discharge observed.    Uterus Uterus is present and palpable. Uterus in normal position and normal size.        Adnexae Bilateral ovaries present and palpable. No tenderness on palpation.         Rectovaginal No rectal exam completed today since patient had no rectal complaints. No skin abnormalities observed on exam.     Smoking History: Patient has {Blank single:19197::never smoked,is a former smoker,is a current smoker at *** packs per day} ***referred to quit line.    Patient Navigation: Patient education provided. Access to services provided for patient through [facility] program.    Breast and Cervical Cancer Risk Assessment: Patient {Blank single:19197::has,does not have} family history of breast cancer, known genetic mutations, or radiation treatment to the chest before age 59. Patient {Blank single:19197::has,does not have} history of cervical dysplasia, immunocompromised, or DES exposure in-utero.  Risk Assessment   No risk assessment data     A: [facility] exam with pap smear Complaint of ***  P: Referred patient to the [facility] for a left breast diagnostic mammogram per recommendation. Appointment scheduled Tuesday, June 23, 2024 at 1520.  Driscilla Wanda SQUIBB, RN 06/22/2024 8:23 AM

## 2024-06-23 ENCOUNTER — Ambulatory Visit
Admission: RE | Admit: 2024-06-23 | Discharge: 2024-06-23 | Disposition: A | Discharge status: Home / Self Care | Payer: Self-pay | Source: Ambulatory Visit | Attending: Obstetrics and Gynecology | Admitting: Obstetrics and Gynecology

## 2024-06-23 ENCOUNTER — Ambulatory Visit: Payer: MEDICAID | Attending: Obstetrics and Gynecology | Admitting: *Deleted

## 2024-06-23 VITALS — BP 103/73 | Ht 62.0 in | Wt 168.2 lb

## 2024-06-23 DIAGNOSIS — N6324 Unspecified lump in the left breast, lower inner quadrant: Secondary | ICD-10-CM

## 2024-06-23 DIAGNOSIS — R928 Other abnormal and inconclusive findings on diagnostic imaging of breast: Secondary | ICD-10-CM | POA: Insufficient documentation

## 2024-06-23 DIAGNOSIS — Z01419 Encounter for gynecological examination (general) (routine) without abnormal findings: Secondary | ICD-10-CM

## 2024-06-23 DIAGNOSIS — Z1239 Encounter for other screening for malignant neoplasm of breast: Secondary | ICD-10-CM

## 2024-06-23 DIAGNOSIS — N6325 Unspecified lump in the left breast, overlapping quadrants: Secondary | ICD-10-CM

## 2024-06-23 NOTE — Patient Instructions (Signed)
 Explained breast self awareness with Katie Cruz. Patient did not need a Pap smear today due to last Pap smear and HPV typing was 04/06/2024. Let her know [facility] will cover Pap smears and HPV typing every 5 years unless has a history of abnormal Pap smears. Referred patient to the [facility] for a left breast diagnostic mammogram per recommendation. Appointment scheduled Tuesday, June 23, 2024 at 1520. Patient aware of appointment and will be there. Katie Cruz verbalized understanding.  Memory Heinrichs, Wanda Ship, RN 2:27 PM

## 2024-06-24 ENCOUNTER — Other Ambulatory Visit: Payer: Self-pay

## 2024-06-24 DIAGNOSIS — N6320 Unspecified lump in the left breast, unspecified quadrant: Secondary | ICD-10-CM

## 2024-07-07 ENCOUNTER — Ambulatory Visit: Payer: Self-pay | Admitting: Obstetrics and Gynecology

## 2024-07-07 DIAGNOSIS — R928 Other abnormal and inconclusive findings on diagnostic imaging of breast: Secondary | ICD-10-CM
